# Patient Record
Sex: Female | Born: 1995 | Race: Black or African American | Hispanic: No | Marital: Single | Smoking: Never smoker
Health system: Southern US, Community
[De-identification: ages and names within clinical notes are randomized; demographics above are authoritative.]

## PROBLEM LIST (undated history)

## (undated) DIAGNOSIS — D649 Anemia, unspecified: Secondary | ICD-10-CM

## (undated) DIAGNOSIS — J45990 Exercise induced bronchospasm: Secondary | ICD-10-CM

## (undated) DIAGNOSIS — Z8619 Personal history of other infectious and parasitic diseases: Secondary | ICD-10-CM

## (undated) HISTORY — DX: Personal history of other infectious and parasitic diseases: Z86.19

## (undated) HISTORY — DX: Exercise induced bronchospasm: J45.990

---

## 2015-01-18 LAB — OB RESULTS CONSOLE ABO/RH: RH TYPE: POSITIVE

## 2015-01-18 LAB — OB RESULTS CONSOLE HIV ANTIBODY (ROUTINE TESTING): HIV: NONREACTIVE

## 2015-01-18 LAB — OB RESULTS CONSOLE RUBELLA ANTIBODY, IGM: RUBELLA: IMMUNE

## 2015-01-18 LAB — OB RESULTS CONSOLE GC/CHLAMYDIA
Chlamydia: NEGATIVE
Gonorrhea: NEGATIVE

## 2015-01-18 LAB — OB RESULTS CONSOLE RPR: RPR: NONREACTIVE

## 2015-01-18 LAB — OB RESULTS CONSOLE GBS: GBS: NEGATIVE

## 2015-01-18 LAB — OB RESULTS CONSOLE HEPATITIS B SURFACE ANTIGEN: Hepatitis B Surface Ag: NEGATIVE

## 2015-01-18 LAB — OB RESULTS CONSOLE ANTIBODY SCREEN: Antibody Screen: NEGATIVE

## 2015-02-07 ENCOUNTER — Telehealth (HOSPITAL_COMMUNITY): Payer: Self-pay | Admitting: *Deleted

## 2015-02-07 ENCOUNTER — Encounter (HOSPITAL_COMMUNITY): Payer: Self-pay | Admitting: *Deleted

## 2015-02-07 NOTE — Telephone Encounter (Signed)
Preadmission screen  

## 2015-02-12 ENCOUNTER — Other Ambulatory Visit: Payer: Self-pay | Admitting: Obstetrics and Gynecology

## 2015-02-12 ENCOUNTER — Inpatient Hospital Stay (HOSPITAL_COMMUNITY): Payer: Medicaid Other | Admitting: Anesthesiology

## 2015-02-12 ENCOUNTER — Encounter (HOSPITAL_COMMUNITY): Payer: Self-pay

## 2015-02-12 ENCOUNTER — Inpatient Hospital Stay (HOSPITAL_COMMUNITY)
Admission: RE | Admit: 2015-02-12 | Discharge: 2015-02-14 | DRG: 775 | Disposition: A | Discharge status: Home / Self Care | Payer: Medicaid Other | Source: Ambulatory Visit | Attending: Obstetrics and Gynecology | Admitting: Obstetrics and Gynecology

## 2015-02-12 VITALS — BP 109/63 | HR 66 | Temp 98.7°F | Resp 18 | Ht 67.0 in | Wt 170.0 lb

## 2015-02-12 DIAGNOSIS — D649 Anemia, unspecified: Secondary | ICD-10-CM | POA: Diagnosis present

## 2015-02-12 DIAGNOSIS — O9902 Anemia complicating childbirth: Secondary | ICD-10-CM | POA: Diagnosis present

## 2015-02-12 DIAGNOSIS — Z3A39 39 weeks gestation of pregnancy: Secondary | ICD-10-CM | POA: Diagnosis not present

## 2015-02-12 DIAGNOSIS — Z3490 Encounter for supervision of normal pregnancy, unspecified, unspecified trimester: Secondary | ICD-10-CM

## 2015-02-12 HISTORY — DX: Anemia, unspecified: D64.9

## 2015-02-12 LAB — CBC
HCT: 27.9 % — ABNORMAL LOW (ref 36.0–46.0)
Hemoglobin: 8.6 g/dL — ABNORMAL LOW (ref 12.0–15.0)
MCH: 24 pg — ABNORMAL LOW (ref 26.0–34.0)
MCHC: 30.8 g/dL (ref 30.0–36.0)
MCV: 77.9 fL — ABNORMAL LOW (ref 78.0–100.0)
Platelets: 245 10*3/uL (ref 150–400)
RBC: 3.58 MIL/uL — ABNORMAL LOW (ref 3.87–5.11)
RDW: 14.9 % (ref 11.5–15.5)
WBC: 6.7 10*3/uL (ref 4.0–10.5)

## 2015-02-12 MED ORDER — OXYCODONE-ACETAMINOPHEN 5-325 MG PO TABS: 1.0000 | ORAL_TABLET | ORAL | Status: DC | PRN

## 2015-02-12 MED ORDER — ONDANSETRON HCL 4 MG/2ML IJ SOLN
4.0000 mg | INTRAMUSCULAR | Status: DC | PRN
Start: 2015-02-12 — End: 2015-02-14

## 2015-02-12 MED ORDER — LACTATED RINGERS IV SOLN: 500.0000 mL | INTRAVENOUS | Status: DC | PRN

## 2015-02-12 MED ORDER — LIDOCAINE HCL (PF) 1 % IJ SOLN
INTRAMUSCULAR | Status: DC | PRN
  Administered 2015-02-12 (×2): 4 mL via EPIDURAL

## 2015-02-12 MED ORDER — DIPHENHYDRAMINE HCL 50 MG/ML IJ SOLN: 12.5000 mg | INTRAMUSCULAR | Status: DC | PRN

## 2015-02-12 MED ORDER — ACETAMINOPHEN 325 MG PO TABS: 650.0000 mg | ORAL_TABLET | ORAL | Status: DC | PRN

## 2015-02-12 MED ORDER — LACTATED RINGERS IV SOLN: INTRAVENOUS | Status: AC

## 2015-02-12 MED ORDER — SODIUM BICARBONATE 8.4 % IV SOLN
INTRAVENOUS | Status: DC | PRN
  Administered 2015-02-12: 4 mL via EPIDURAL
  Administered 2015-02-12: 5 mL via EPIDURAL

## 2015-02-12 MED ORDER — TETANUS-DIPHTH-ACELL PERTUSSIS 5-2.5-18.5 LF-MCG/0.5 IM SUSP: 0.5000 mL | Freq: Once | INTRAMUSCULAR | Status: DC

## 2015-02-12 MED ORDER — OXYTOCIN 40 UNITS IN LACTATED RINGERS INFUSION - SIMPLE MED
1.0000 m[IU]/min | INTRAVENOUS | Status: DC
  Administered 2015-02-12: 1 m[IU]/min via INTRAVENOUS
  Filled 2015-02-12: qty 1000

## 2015-02-12 MED ORDER — EPHEDRINE 5 MG/ML INJ
10.0000 mg | INTRAVENOUS | Status: DC | PRN
  Filled 2015-02-12: qty 2

## 2015-02-12 MED ORDER — WITCH HAZEL-GLYCERIN EX PADS: 1.0000 "application " | MEDICATED_PAD | CUTANEOUS | Status: DC | PRN

## 2015-02-12 MED ORDER — LACTATED RINGERS IV SOLN
INTRAVENOUS | Status: DC
  Administered 2015-02-12: 09:00:00 via INTRAVENOUS

## 2015-02-12 MED ORDER — ZOLPIDEM TARTRATE 5 MG PO TABS: 5.0000 mg | ORAL_TABLET | Freq: Every evening | ORAL | Status: DC | PRN

## 2015-02-12 MED ORDER — LANOLIN HYDROUS EX OINT: TOPICAL_OINTMENT | CUTANEOUS | Status: DC | PRN

## 2015-02-12 MED ORDER — PRENATAL MULTIVITAMIN CH
1.0000 | ORAL_TABLET | Freq: Every day | ORAL | Status: DC
  Administered 2015-02-13: 1 via ORAL
  Filled 2015-02-12: qty 1

## 2015-02-12 MED ORDER — ONDANSETRON HCL 4 MG PO TABS: 4.0000 mg | ORAL_TABLET | ORAL | Status: DC | PRN

## 2015-02-12 MED ORDER — OXYCODONE-ACETAMINOPHEN 5-325 MG PO TABS: 2.0000 | ORAL_TABLET | ORAL | Status: DC | PRN

## 2015-02-12 MED ORDER — DIBUCAINE 1 % RE OINT: 1.0000 "application " | TOPICAL_OINTMENT | RECTAL | Status: DC | PRN

## 2015-02-12 MED ORDER — SIMETHICONE 80 MG PO CHEW: 80.0000 mg | CHEWABLE_TABLET | ORAL | Status: DC | PRN

## 2015-02-12 MED ORDER — LIDOCAINE HCL (PF) 1 % IJ SOLN
30.0000 mL | INTRAMUSCULAR | Status: DC | PRN
  Filled 2015-02-12: qty 30

## 2015-02-12 MED ORDER — MEASLES, MUMPS & RUBELLA VAC ~~LOC~~ INJ: 0.5000 mL | INJECTION | Freq: Once | SUBCUTANEOUS | Status: DC

## 2015-02-12 MED ORDER — PRENATAL MULTIVITAMIN CH: 1.0000 | ORAL_TABLET | Freq: Every day | ORAL | Status: DC

## 2015-02-12 MED ORDER — FENTANYL 2.5 MCG/ML BUPIVACAINE 1/10 % EPIDURAL INFUSION (WH - ANES)
14.0000 mL/h | INTRAMUSCULAR | Status: DC | PRN
  Administered 2015-02-12 (×2): 14 mL/h via EPIDURAL
  Filled 2015-02-12: qty 125

## 2015-02-12 MED ORDER — OXYTOCIN BOLUS FROM INFUSION: 500.0000 mL | INTRAVENOUS | Status: DC

## 2015-02-12 MED ORDER — IBUPROFEN 600 MG PO TABS
600.0000 mg | ORAL_TABLET | Freq: Four times a day (QID) | ORAL | Status: DC
  Administered 2015-02-12 – 2015-02-13 (×6): 600 mg via ORAL
  Filled 2015-02-12 (×8): qty 1

## 2015-02-12 MED ORDER — PHENYLEPHRINE 40 MCG/ML (10ML) SYRINGE FOR IV PUSH (FOR BLOOD PRESSURE SUPPORT)
80.0000 ug | PREFILLED_SYRINGE | INTRAVENOUS | Status: DC | PRN
  Filled 2015-02-12: qty 2
  Filled 2015-02-12: qty 20

## 2015-02-12 MED ORDER — BUTORPHANOL TARTRATE 1 MG/ML IJ SOLN
1.0000 mg | Freq: Once | INTRAMUSCULAR | Status: AC
  Administered 2015-02-12: 1 mg via INTRAVENOUS
  Filled 2015-02-12: qty 1

## 2015-02-12 MED ORDER — BENZOCAINE-MENTHOL 20-0.5 % EX AERO
1.0000 "application " | INHALATION_SPRAY | CUTANEOUS | Status: DC | PRN
  Administered 2015-02-12: 1 via TOPICAL
  Filled 2015-02-12: qty 56

## 2015-02-12 MED ORDER — DIPHENHYDRAMINE HCL 25 MG PO CAPS
25.0000 mg | ORAL_CAPSULE | Freq: Four times a day (QID) | ORAL | Status: DC | PRN
Start: 2015-02-12 — End: 2015-02-14

## 2015-02-12 MED ORDER — SENNOSIDES-DOCUSATE SODIUM 8.6-50 MG PO TABS
2.0000 | ORAL_TABLET | ORAL | Status: DC
  Administered 2015-02-13: 2 via ORAL
  Filled 2015-02-12 (×2): qty 2

## 2015-02-12 MED ORDER — OXYTOCIN 40 UNITS IN LACTATED RINGERS INFUSION - SIMPLE MED: 62.5000 mL/h | INTRAVENOUS | Status: AC

## 2015-02-12 MED ORDER — OXYTOCIN 40 UNITS IN LACTATED RINGERS INFUSION - SIMPLE MED
62.5000 mL/h | INTRAVENOUS | Status: DC
  Administered 2015-02-12: 62.5 mL/h via INTRAVENOUS

## 2015-02-12 NOTE — Anesthesia Procedure Notes (Signed)
Epidural Patient location during procedure: OB Start time: 02/12/2015 11:14 AM  Staffing Anesthesiologist: Mal AmabileFOSTER, Rotha Cassels Performed by: anesthesiologist   Preanesthetic Checklist Completed: patient identified, site marked, surgical consent, pre-op evaluation, timeout performed, IV checked, risks and benefits discussed and monitors and equipment checked  Epidural Patient position: sitting Prep: site prepped and draped and DuraPrep Patient monitoring: continuous pulse ox and blood pressure Approach: midline Location: L3-L4 Injection technique: LOR air  Needle:  Needle type: Tuohy  Needle gauge: 17 G Needle length: 9 cm and 9 Needle insertion depth: 5 cm cm Catheter type: closed end flexible Catheter size: 19 Gauge Catheter at skin depth: 10 cm Test dose: negative and Other  Assessment Events: blood not aspirated, injection not painful, no injection resistance, negative IV test and no paresthesia  Additional Notes Patient identified. Risks and benefits discussed including failed block, incomplete  Pain control, post dural puncture headache, nerve damage, paralysis, blood pressure Changes, nausea, vomiting, reactions to medications-both toxic and allergic and post Partum back pain. All questions were answered. Patient expressed understanding and wished to proceed. Sterile technique was used throughout procedure. Epidural site was Dressed with sterile barrier dressing. No paresthesias, signs of intravascular injection Or signs of intrathecal spread were encountered.  Patient was more comfortable after the epidural was dosed. Please see RN's note for documentation of vital signs and FHR which are stable.

## 2015-02-12 NOTE — Progress Notes (Signed)
Patient ID: Daryel GeraldCynthia P Arocho, female   DOB: 09/18/1995, 19 y.o.   MRN: [id number] Pt admitted for induction. Cervix is 4 cm 70 % effaced and the vertex is at - 2 station. Arom produced slightly blood tinged fluid. Will begin pitocin. Last CT was + 01-18-15

## 2015-02-12 NOTE — Progress Notes (Signed)
Patient ID: Katrina GeraldCynthia P Rajagopalan, female   DOB: 09/11/1995, 19 y.o.   MRN: [id number] I was called at 1:04 PM and adviswed the pt was fully dilated with the vertex at +1 station. I came immediately and we instructed the pt to push.She has begun her pushing efforts. A dime's worth of caput is showing with pushing.

## 2015-02-12 NOTE — Progress Notes (Signed)
Patient ID: Katrina GeraldCynthia P Meares, female   DOB: 03/25/1995, 19 y.o.   MRN: [id number] Delivery note:   Pt pushed well and after proper positioning of her legs, she made good progress and delivered a living female infant spontaneously ROA over a second degree ML laceration. Apgars were 9 and 9 at 1 and 5 minutes. The placenta was intact and the uterus was normal. Rectal was negative. The laceration was repaired with 3-0 vicryl. EBL 200 cc's.

## 2015-02-12 NOTE — Anesthesia Preprocedure Evaluation (Signed)
Anesthesia Evaluation  Patient identified by MRN, date of birth, ID band Patient awake    Reviewed: Allergy & Precautions, H&P , Patient's Chart, lab work & pertinent test results  Airway Mallampati: II  TM Distance: >3 FB Neck ROM: full    Dental no notable dental hx. (+) Teeth Intact   Pulmonary neg pulmonary ROS,    Pulmonary exam normal breath sounds clear to auscultation       Cardiovascular negative cardio ROS Normal cardiovascular exam Rhythm:regular Rate:Normal     Neuro/Psych negative neurological ROS  negative psych ROS   GI/Hepatic negative GI ROS, Neg liver ROS,   Endo/Other  negative endocrine ROS  Renal/GU negative Renal ROS  negative genitourinary   Musculoskeletal   Abdominal   Peds  Hematology  (+) anemia ,   Anesthesia Other Findings   Reproductive/Obstetrics (+) Pregnancy                             Anesthesia Physical Anesthesia Plan  ASA: II  Anesthesia Plan: Epidural   Post-op Pain Management:    Induction:   Airway Management Planned:   Additional Equipment:   Intra-op Plan:   Post-operative Plan:   Informed Consent: I have reviewed the patients History and Physical, chart, labs and discussed the procedure including the risks, benefits and alternatives for the proposed anesthesia with the patient or authorized representative who has indicated his/her understanding and acceptance.     Plan Discussed with: Anesthesiologist  Anesthesia Plan Comments:         Anesthesia Quick Evaluation  

## 2015-02-12 NOTE — Lactation Note (Signed)
This note was copied from the chart of Katrina Sherisse Debroux. Lactation Consultation Note  Kirt BoysMartha RN helping mother breastfeed upon entering on R side. Mother needs some help compressing breast to get a deep latch. R nipple had piercing that started to close years ago.  L nipple has a current pierced bar. Helped mother latch on both breasts, some sucks and swallows observed. Mother needed help getting baby back on intermittently. Provided education to young parents regarding supply and demand, cluster feeding, size of stomach. Mother wants to breast and formula feed. Discussed the importance of establishing her milk supply.  Breastfeed first and then if desired offer formula after bf. Mom encouraged to feed baby 8-12 times/24 hours and with feeding cues.  Mom made aware of O/P services, breastfeeding support groups, community resources, and our phone # for post-discharge questions.    Patient Name: Katrina Aurther LoftCynIthia Cendejas Today's Date: 02/12/2015 Reason for consult: Initial assessment   Maternal Data Has patient been taught Hand Expression?: Yes Does the patient have breastfeeding experience prior to this delivery?: No  Feeding Feeding Type: Breast Fed Length of feed: 0 min  LATCH Score/Interventions Latch: Repeated attempts needed to sustain latch, nipple held in mouth throughout feeding, stimulation needed to elicit sucking reflex. Intervention(s): Skin to skin;Teach feeding cues;Waking techniques Intervention(s): Adjust position;Assist with latch;Breast massage;Breast compression  Audible Swallowing: None Intervention(s): Skin to skin;Hand expression  Type of Nipple: Everted at rest and after stimulation  Comfort (Breast/Nipple): Soft / non-tender     Hold (Positioning): Assistance needed to correctly position infant at breast and maintain latch. Intervention(s): Breastfeeding basics reviewed;Support Pillows;Position options;Skin to skin  LATCH Score: 6  Lactation Tools  Discussed/Used     Consult Status Consult Status: Follow-up Date: 02/13/15 Follow-up type: In-patient    Dahlia ByesBerkelhammer, [facility] 02/12/2015, 9:17 PM

## 2015-02-13 LAB — CBC
HEMATOCRIT: 26.7 % — AB (ref 36.0–46.0)
Hemoglobin: 8.3 g/dL — ABNORMAL LOW (ref 12.0–15.0)
MCH: 24.4 pg — ABNORMAL LOW (ref 26.0–34.0)
MCHC: 31.1 g/dL (ref 30.0–36.0)
MCV: 78.5 fL (ref 78.0–100.0)
Platelets: 216 10*3/uL (ref 150–400)
RBC: 3.4 MIL/uL — ABNORMAL LOW (ref 3.87–5.11)
RDW: 15 % (ref 11.5–15.5)
WBC: 10.8 10*3/uL — ABNORMAL HIGH (ref 4.0–10.5)

## 2015-02-13 LAB — RPR: RPR: NONREACTIVE

## 2015-02-13 NOTE — Progress Notes (Signed)
Patient ID: Katrina AlleyYNITHIA P Cannon, female   DOB: 06/20/1995, 19 y.o.   MRN: [id number] #1 afebrile No complaints HGB stable

## 2015-02-13 NOTE — Anesthesia Postprocedure Evaluation (Signed)
Anesthesia Post Note  Patient: Katrina Cannon  Procedure(s) Performed: * No procedures listed *  Patient location during evaluation: Mother Baby Anesthesia Type: Epidural Level of consciousness: awake and alert, oriented and patient cooperative Pain management: pain level controlled Vital Signs Assessment: post-procedure vital signs reviewed and stable Respiratory status: spontaneous breathing Cardiovascular status: stable Postop Assessment: no headache, epidural receding, patient able to bend at knees and no signs of nausea or vomiting Anesthetic complications: no    Last Vitals:  Filed Vitals:   02/12/15 2120 02/13/15 0520  BP: 128/59 108/58  Pulse: 81 62  Temp: 36.9 C 36.9 C  Resp: 18 16    Last Pain:  Filed Vitals:   02/13/15 0527  PainSc: Asleep                 Merrilyn PumaWRINKLE,Lyman Balingit

## 2015-02-14 MED ORDER — FERROUS SULFATE 325 (65 FE) MG PO TABS: 325.0000 mg | ORAL_TABLET | Freq: Two times a day (BID) | ORAL | Status: DC

## 2015-02-14 MED ORDER — IBUPROFEN 600 MG PO TABS: 600.0000 mg | ORAL_TABLET | Freq: Four times a day (QID) | ORAL | Status: DC | PRN

## 2015-02-14 NOTE — Discharge Summary (Signed)
Katrina Cannon:  Colglazier, Shandi             ACCOUNT NO.:  [id number]  MEDICAL RECORD NO.:  [id number]  LOCATION:  9135                          FACILITY:  [facility]  PHYSICIAN:  Malachi Prohomas F. Ambrose MantleHenley, M.D. DATE OF BIRTH:  01/24/1996  DATE OF ADMISSION:  02/12/2015 DATE OF DISCHARGE:  02/14/2015                              DISCHARGE SUMMARY   This is a 19 year old black female, who was admitted for induction of labor at her request.  On admission, she was 4 cm, she reached full dilatation, and pushed well, and after proper positioning of her legs, she made good progress and delivered a living female infant spontaneously ROA over a second-degree midline laceration by Dr. Ambrose MantleHenley. Apgars were 9 and 9 at one and five minutes.  The placenta was intact. Uterus was normal.  Rectal was negative.  Laceration was repaired with 3- 0 Vicryl.  Blood loss 200 mL.  Postpartum, the patient did well and was discharged on the second postpartum day.  Initial hemoglobin 8.6, hematocrit 27.9, white count 6700, platelet count 245,000, and followup hemoglobin 8.3.  RPR nonreactive.  FINAL DIAGNOSES:  Intrauterine pregnancy at 39 weeks, delivered vertex.  OPERATION:  Spontaneous delivery vertex, second-degree midline laceration and repair.  FINAL CONDITION:  Improved.  INSTRUCTIONS:  Include our regular discharge instruction booklet as well as the after visit summary.  Prescription for Motrin 600 mg 30 tablets, 1 every 6 hours as needed for pain and ferrous sulfate 325 mg 60 tablets 1 twice a day with 3 refills.  The patient is advised to return to the office in 6 weeks for followup examination.     Malachi Prohomas F. Ambrose MantleHenley, M.D.     TFH/MEDQ  D:  02/14/2015  T:  02/14/2015  Job:  [id number]

## 2015-02-14 NOTE — H&P (Signed)
Katrina Cannon, Katrina Cannon             ACCOUNT NO.:  [id number]  MEDICAL RECORD NO.:  [id number]  LOCATION:  9135                          FACILITY:  [facility]  PHYSICIAN:  Malachi Pro. Ambrose Mantle, M.D. DATE OF BIRTH:  [birth year]  DATE OF ADMISSION:  02/12/2015 DATE OF DISCHARGE:                             HISTORY & PHYSICAL   HISTORY OF PRESENT ILLNESS:  This is a 19 year old black female, para 0- 0-1-0, gravida 2, [facility] February 19, 2015, based on an ultrasound on Jul 23, 2014, 9 weeks and 6 days, who is admitted for induction of labor at her request.  The patient began her prenatal course in [place], [place], had 1 visit and an ultrasound that placed her due date at February 19, 2015, done at 9 weeks and 6 days.  She came to our office at 26 weeks and 6 days for her first visit.  On her ultrasound here, the cervix was shortened and she was begun on vaginal progesterone.  She was noted to have a globe in her left axilla at 29 weeks that was considered axillary breast tissue.  She was offered imaging, she did not take the imaging.  At 33 weeks, she complained of decreased fetal movement. Nonstress test was done, size seemed to be less than dates, so ultrasound was suggested.  At that ultrasound at 35 weeks and 3 days, the fetus was thought to be 34 weeks and 0 days, 21.9% estimated, weight was 5 pounds 2 ounces at that time.  AFI was 13.97.  She was noted to be 2 cm, 50%, at 35 weeks and 3 days, and on January 26, 2015, she called and asked for induction on December 17th.  I was on-call that day, Dr. Jackelyn Knife asked her to ask me and I agreed to induce her labor.  Blood group and type is O positive with a negative antibody, RPR negative, hepatitis B surface antigen negative, HIV negative, GC and Chlamydia negative on January 18, 2015.  She did have a history of positive chlamydia early in pregnancy.  One-hour Glucola was 68.  On January 18, 2015, she did have another positive chlamydia and this  was treated with Zithromax 1 g.  Group B strep was negative.  PAST MEDICAL HISTORY:  Reveals no significant illnesses, although she has had positive chlamydia.  SURGICAL HISTORY:  None.  ALLERGIES:  No known drug allergies.  No latex or food allergies.  The patient was treated for short cervical length with vaginal progesterone.  SOCIAL HISTORY:  The patient smoked prior to pregnancy.  Does not drink or use illicit drugs.  She is single.  FAMILY HISTORY:  Mother with anemia.  Maternal grandmother with high blood pressure.  PHYSICAL EXAMINATION:  VITAL SIGNS:  At her last prenatal visit on February 08, 2015, the blood pressure was 150/78, and 124/78, her weight was 171 pounds. HEART:  Normal size and sounds. LUNGS:  Clear to auscultation. GU:  Fundal height was 37 cm.  The cervix was 3 cm, 70%, vertex at a -2 by Dr. Jackelyn Knife.  ADMITTING IMPRESSION:  Intrauterine pregnancy at 39 weeks.  The patient requested induction of labor.  She is admitted for induction of labor.  Malachi Prohomas F. Ambrose MantleHenley, M.D.     TFH/MEDQ  D:  02/12/2015  T:  02/12/2015  Job:  [id number]

## 2015-02-14 NOTE — Progress Notes (Signed)
Patient ID: Katrina AlleyYNITHIA P Cannon, female   DOB: 10/25/1995, 19 y.o.   MRN: [id number] #2 afebrile BP normal Will d/c on ferrous sulfate.

## 2015-02-14 NOTE — Discharge Instructions (Signed)
booklet °

## 2015-02-14 NOTE — Lactation Note (Addendum)
This note was copied from the chart of Katrina Cannon. Lactation Consultation Note  Patient Name: Katrina Cannon Today's Date: 02/14/2015 Reason for consult: Follow-up assessment   Follow up with mom prior to D/C. Mom says she plans to breast and bottle feed. Infant with 8 BF for 15-45 minutes, 2 bottle feeds of 20 cc Alimentum, 4 voids and 3 stools since birth. Infant 6 lb 5.6 oz with 4% weight loss since birth. Infant cueing to feed, mom opening a bottle. Mom reports nipple tenderness with latch that improves with feeding. Offered to assist putting infant to breast, mom declined assistance. Reviewed infant stomach size, NB Nutritional Needs, NB feeding behavior, Engorgement protocol, Supply and Demand, I/O, BF Positions. Enc mom to feed infant 8-12 x in 24 hours at first feeding cues at the breast prior to formula feeding to establish a good milk supply. MOm voiced understanding to all teaching. Reviewed all BF information in Taking Care of Baby and Me Booklet. [facility] and will call and make WIC appt. She currently does not have a Ped. Advised her that BF babies usually seen for f/u within 1-2 days of d/c. Reviewed LC Brochure, mom aware of OP LC Services, Support Groups and phone #. Advised mom to call with questions/concerns.    Maternal Data Formula Feeding for Exclusion: Yes Reason for exclusion: Mother's choice to formula and breast feed on admission Has patient been taught Hand Expression?: Yes Does the patient have breastfeeding experience prior to this delivery?: No  Feeding    LATCH Score/Interventions                      Lactation Tools Discussed/Used WIC Program: Yes Pump Review: Milk Storage   Consult Status Consult Status: Complete Follow-up type: Call as needed    Ed BlalockSharon S Arali Somera 02/14/2015, 8:38 AM

## 2015-02-19 ENCOUNTER — Inpatient Hospital Stay (HOSPITAL_COMMUNITY): Admission: AD | Admit: 2015-02-19 | Payer: Self-pay | Source: Ambulatory Visit | Admitting: Obstetrics and Gynecology

## 2016-02-07 ENCOUNTER — Ambulatory Visit: Payer: Medicaid Other | Admitting: Family

## 2016-02-14 ENCOUNTER — Ambulatory Visit (INDEPENDENT_AMBULATORY_CARE_PROVIDER_SITE_OTHER): Payer: PRIVATE HEALTH INSURANCE | Admitting: Family

## 2016-02-14 ENCOUNTER — Other Ambulatory Visit (INDEPENDENT_AMBULATORY_CARE_PROVIDER_SITE_OTHER): Payer: PRIVATE HEALTH INSURANCE

## 2016-02-14 ENCOUNTER — Encounter: Payer: Self-pay | Admitting: Family

## 2016-02-14 VITALS — BP 122/70 | HR 62 | Temp 98.2°F | Wt 145.0 lb

## 2016-02-14 DIAGNOSIS — M79674 Pain in right toe(s): Secondary | ICD-10-CM | POA: Diagnosis not present

## 2016-02-14 DIAGNOSIS — M79675 Pain in left toe(s): Secondary | ICD-10-CM

## 2016-02-14 DIAGNOSIS — Z23 Encounter for immunization: Secondary | ICD-10-CM

## 2016-02-14 DIAGNOSIS — D509 Iron deficiency anemia, unspecified: Secondary | ICD-10-CM | POA: Diagnosis not present

## 2016-02-14 LAB — IBC PANEL
IRON: 145 ug/dL (ref 42–145)
Saturation Ratios: 37.7 % (ref 20.0–50.0)
TRANSFERRIN: 275 mg/dL (ref 212.0–360.0)

## 2016-02-14 LAB — CBC
HEMATOCRIT: 39.5 % (ref 36.0–46.0)
Hemoglobin: 13.1 g/dL (ref 12.0–15.0)
MCHC: 33.1 g/dL (ref 30.0–36.0)
MCV: 87.6 fl (ref 78.0–100.0)
Platelets: 305 10*3/uL (ref 150.0–400.0)
RBC: 4.51 Mil/uL (ref 3.87–5.11)
RDW: 14.7 % (ref 11.5–15.5)
WBC: 6.8 10*3/uL (ref 4.0–10.5)

## 2016-02-14 NOTE — Progress Notes (Signed)
Subjective:    Patient ID: Katrina Cannon, female    DOB: 09/03/1995, 20 y.o.   MRN: [id number]  Chief Complaint  Patient presents with  . Establish Care    iron deficiency, toe pain    HPI:  Katrina Cannon is a 20 y.o. female who  has a past medical history of Anemia; Exercise-induced asthma; and chlamydia infection. and presents today for an office visit to establish care.  1.) Anemia - Previously diagnosed with anemia. Currently maintained on ferrous sulfate that she reports taking as prescribed and denies adverse side effects. Does have occasional shortness of breath with activities. Denies bleeding or heavy menstrual cycles. Has significant family history for anemia.   2.) Great toe sprains - This is a new problem. Associated symptom of bilateral great toe pain has been going on since she was in high school about 3 years ago that generally waxes and wanes. Describes that she used to run track on a regular basis and does not remember a specific injury to either great toe, although remembers having pain and having the athletic trainer tape her toes on occasion which seemed to help. Not currently having pain but notes it to be around the MCP joint. Modifying factors include Aleve and ibuprofen as needed as well as ice when painful. Severity ranges from mild to severe at times. No numbness or tingling. Pain is described as occasionally sharp when it occurs.    No Known Allergies    Outpatient Medications Prior to Visit  Medication Sig Dispense Refill  . ferrous sulfate 325 (65 FE) MG tablet Take 1 tablet (325 mg total) by mouth 2 (two) times daily with a meal. 60 tablet 3  . ibuprofen (ADVIL,MOTRIN) 600 MG tablet Take 1 tablet (600 mg total) by mouth every 6 (six) hours as needed. 30 tablet 0  . Prenatal Vit-Fe Fumarate-FA (PRENATAL MULTIVITAMIN) TABS tablet Take 1 tablet by mouth daily at 12 noon.     No facility-administered medications prior to visit.      Past Medical  History:  Diagnosis Date  . Anemia   . Exercise-induced asthma   . Hx of chlamydia infection       History reviewed. No pertinent surgical history.    Family History  Problem Relation Age of Onset  . Healthy Mother   . Healthy Father   . Hypertension Maternal Grandmother   . Hypertension Maternal Grandfather       Social History   Social History  . Marital status: Single    Spouse name: N/A  . Number of children: 1  . Years of education: 8315   Occupational History  . Sales Associate    Social History Main Topics  . Smoking status: Never Smoker  . Smokeless tobacco: Never Used  . Alcohol use Yes     Comment: occasionally  . Drug use: No  . Sexual activity: Not on file   Other Topics Concern  . Not on file   Social History Narrative  . No narrative on file      Review of Systems  Constitutional: Negative for chills and fever.  Respiratory: Positive for shortness of breath (Occasional).   Cardiovascular: Negative for chest pain, palpitations and leg swelling.  Musculoskeletal:       Positive for bilateral toe pain  Neurological: Negative for weakness and numbness.       Objective:    BP 122/70   Pulse 62   Temp 98.2 F (36.8 C)  Wt 145 lb (65.8 kg)   SpO2 99%   BMI 22.71 kg/m  Nursing note and vital signs reviewed.  Physical Exam  Constitutional: She is oriented to person, place, and time. She appears well-developed and well-nourished. No distress.  Cardiovascular: Normal rate, regular rhythm, normal heart sounds and intact distal pulses.   Pulmonary/Chest: Effort normal and breath sounds normal.  Musculoskeletal:  Bilateral great toes - no obvious deformity, discoloration, or edema. No palpable tenderness, crepitus, or deformity. Range of motion is equal bilaterally with no discomfort Neer and ranges of motion. Capillary refill is intact and appropriate. Metatarsal glide is negative.  Neurological: She is alert and oriented to person,  place, and time.  Skin: Skin is warm and dry. There is pallor.  Psychiatric: She has a normal mood and affect. Her behavior is normal. Judgment and thought content normal.        Assessment & Plan:   Problem List Items Addressed This Visit      Other   Iron deficiency anemia - Primary    Previously diagnosed with iron deficiency anemia maintained on ferrous sulfate without complication or side effects. There is mild pallor noted on exam today. Obtain CBC and IBC panel. Continue current dosage of thirst flu pending blood work results.      Relevant Orders   CBC (Completed)   IBC panel (Completed)   Toe pain, bilateral    Bilateral toe pain in the setting of previous 2 injuries secondary to running track with no significant findings or pain today. Recommend conservative treatment with ice, over-the-counter medications as needed for symptom relief and good supportive shoes. No treatment necessary at this time. Follow-up if symptoms worsen or return.       Other Visit Diagnoses    Need for prophylactic vaccination with combined diphtheria-tetanus-pertussis (DTP) vaccine       Relevant Orders   Tdap vaccine greater than or equal to 7yo IM (Completed)       I have discontinued [name]'s prenatal multivitamin. I am also having her maintain her ibuprofen and ferrous sulfate.   Follow-up: Return if symptoms worsen or fail to improve.  Jeanine Luzalone, Icis Budreau, FNP

## 2016-02-14 NOTE — Progress Notes (Signed)
Pre visit review using our clinic review tool, if applicable. No additional management support is needed unless otherwise documented below in the visit note. 

## 2016-02-14 NOTE — Assessment & Plan Note (Signed)
Bilateral toe pain in the setting of previous 2 injuries secondary to running track with no significant findings or pain today. Recommend conservative treatment with ice, over-the-counter medications as needed for symptom relief and good supportive shoes. No treatment necessary at this time. Follow-up if symptoms worsen or return.

## 2016-02-14 NOTE — Patient Instructions (Signed)
Thank you for choosing [facility].  SUMMARY AND INSTRUCTIONS:  Ice your toes as needed when inflamed. Recommend a good supportive shoe such as Allegra, Dansko, or New Balance >700.   Ibuprofen or aleve as needed.  Please schedule a time for your physical at your convenience.  Medication:  Please continue to take your ferrous sulfate.  Labs:  Please stop by the lab on the lower level of the building for your blood work. Your results will be released to MyChart (or called to you) after review, usually within 72 hours after test completion. If any changes need to be made, you will be notified at that same time.  1.) The lab is open from 7:30am to 5:30 pm Monday-Friday 2.) No appointment is necessary 3.) Fasting (if needed) is 6-8 hours after food and drink; black coffee and water are okay   Follow up:  If your symptoms worsen or fail to improve, please contact our office for further instruction, or in case of emergency go directly to the emergency room at the closest medical facility.    Iron Deficiency Anemia, Adult Iron deficiency anemia is a condition in which the concentration of red blood cells or hemoglobin in the blood is below normal because of too little iron. Hemoglobin is a substance in red blood cells that carries oxygen to the body's tissues. When the concentration of red blood cells or hemoglobin is too low, not enough oxygen reaches these tissues. Iron deficiency anemia is usually long-lasting (chronic) and it develops over time. It may or may not cause symptoms. It is a common type of anemia. What are the causes? This condition may be caused by:  Not enough iron in the diet.  Blood loss caused by bleeding in the intestine.  Blood loss from a gastrointestinal condition like Crohn disease.  Frequent blood draws, such as from blood donation.  Abnormal absorption in the gut.  Heavy menstrual periods in women.  Cancers of the gastrointestinal system, such  as colon cancer. What are the signs or symptoms? Symptoms of this condition may include:  Fatigue.  Headache.  Pale skin, lips, and nail beds.  Poor appetite.  Weakness.  Shortness of breath.  Dizziness.  Cold hands and feet.  Fast or irregular heartbeat.  Irritability. This is more common in severe anemia.  Rapid breathing. This is more common in severe anemia. Mild anemia may not cause any symptoms. How is this diagnosed? This condition is diagnosed based on:  Your medical history.  A physical exam.  Blood tests. You may have additional tests to find the underlying cause of your anemia, such as:  Testing for blood in the stool (fecal occult blood test).  A procedure to see inside your colon and rectum (colonoscopy).  A procedure to see inside your esophagus and stomach (endoscopy).  A test in which cells are removed from bone marrow (bone marrow aspiration) or fluid is removed from the bone marrow to be examined (biopsy). This is rarely needed. How is this treated? This condition is treated by correcting the cause of your iron deficiency. Treatment may involve:  Adding iron-rich foods to your diet.  Taking iron supplements. If you are pregnant or breastfeeding, you may need to take extra iron because your normal diet usually does not provide the amount of iron that you need.  Increasing vitamin C intake. Vitamin C helps your body absorb iron. Your health care provider may recommend that you take iron supplements along with a glass of orange  juice or a vitamin C supplement.  Medicines to make heavy menstrual flow lighter.  Surgery. You may need repeat blood tests to determine whether treatment is working. Depending on the underlying cause, the anemia should be corrected within 2 months of starting treatment. If the treatment does not seem to be working, you may need more testing. Follow these instructions at home: Medicines  Take over-the-counter and  prescription medicines only as told by your health care provider. This includes iron supplements and vitamins.  If you cannot tolerate taking iron supplements by mouth, talk with your health care provider about taking them through a vein (intravenously) or an injection into a muscle.  For the best iron absorption, you should take iron supplements when your stomach is empty. If you cannot tolerate them on an empty stomach, you may need to take them with food.  Do not drink milk or take antacids at the same time as your iron supplements. Milk and antacids may interfere with iron absorption.  Iron supplements can cause constipation. To prevent constipation, include fiber in your diet as told by your health care provider. A stool softener may also be recommended. Eating and drinking  Talk with your health care provider before changing your diet. He or she may recommend that you eat foods that contain a lot of iron, such as:  Liver.  Low-fat (lean) beef.  Breads and cereals that have iron added to them (are fortified).  Eggs.  Dried fruit.  Dark green, leafy vegetables.  To help your body use the iron from iron-rich foods, eat those foods at the same time as fresh fruits and vegetables that are high in vitamin C. Foods that are high in vitamin C include:  Oranges.  Peppers.  Tomatoes.  Mangoes.  Drinkenoughfluid to keep your urine clear or pale yellow. General instructions  Return to your normal activities as told by your health care provider. Ask your health care provider what activities are safe for you.  Practice good hygiene. Anemia can make you more prone to illness and infection.  Keep all follow-up visits as told by your health care provider. This is important. Contact a health care provider if:  You feel nauseous or you vomit.  You feel weak.  You have unexplained sweating.  You develop symptoms of constipation, such as:  Having fewer than three bowel  movements a week.  Straining to have a bowel movement.  Having stools that are hard, dry, or larger than normal.  Feeling full or bloated.  Pain in the lower abdomen.  Not feeling relief after having a bowel movement. Get help right away if:  You faint. If this happens, do not drive yourself to the hospital. Call your local emergency services (911 in the [place]  You have chest pain.  You have shortness of breath that:  Is severe.  Gets worse with physical activity.  You have a rapid heartbeat.  You become light-headed when getting up from a sitting or lying down position. This information is not intended to replace advice given to you by your health care provider. Make sure you discuss any questions you have with your health care provider. Document Released: 02/10/2000 Document Revised: 11/02/2015 Document Reviewed: 11/02/2015 Elsevier Interactive Patient Education  2017 [employer]

## 2016-02-14 NOTE — Assessment & Plan Note (Signed)
Previously diagnosed with iron deficiency anemia maintained on ferrous sulfate without complication or side effects. There is mild pallor noted on exam today. Obtain CBC and IBC panel. Continue current dosage of thirst flu pending blood work results.

## 2020-05-03 ENCOUNTER — Ambulatory Visit: Payer: Self-pay | Admitting: Psychology

## 2020-05-18 ENCOUNTER — Ambulatory Visit: Payer: Self-pay | Admitting: Psychology

## 2020-10-05 ENCOUNTER — Ambulatory Visit
Admission: EM | Admit: 2020-10-05 | Discharge: 2020-10-05 | Disposition: A | Discharge status: Home / Self Care | Payer: PRIVATE HEALTH INSURANCE | Attending: Urgent Care | Admitting: Urgent Care

## 2020-10-05 ENCOUNTER — Other Ambulatory Visit: Payer: Self-pay

## 2020-10-05 DIAGNOSIS — L0231 Cutaneous abscess of buttock: Secondary | ICD-10-CM

## 2020-10-05 MED ORDER — NAPROXEN 500 MG PO TABS: 500.0000 mg | ORAL_TABLET | Freq: Two times a day (BID) | ORAL | 0 refills | Status: DC

## 2020-10-05 MED ORDER — DOXYCYCLINE HYCLATE 100 MG PO CAPS: 100.0000 mg | ORAL_CAPSULE | Freq: Two times a day (BID) | ORAL | 0 refills | Status: DC

## 2020-10-05 NOTE — Discharge Instructions (Addendum)
Please change your dressing 3-5 times daily. Do not apply any ointments or creams. Each time you change your dressing, make sure that you are pressing on the wound to get pus to come out.  Try your best to have a family member help you clean gently around the perimeter of the wound with gentle soap and warm water. Pat your wound dry and let it air out if possible to make sure it is dry before reapplying another dressing.   

## 2020-10-05 NOTE — ED Provider Notes (Signed)
  [facility]   MRN: [id number] DOB: May 02, 1995  Subjective:   Katrina Cannon is a 25 y.o. female presenting for 5 day history of recurrent buttock pain, swelling.  Had a similar lesion about 3 months ago that required incision and drainage then as well.  Prior to that, she had never previously had difficulty with boils and infections.  No current facility-administered medications for this encounter. No current outpatient medications on file.   No Known Allergies  Past Medical History:  Diagnosis Date   Anemia    Exercise-induced asthma    Hx of chlamydia infection      History reviewed. No pertinent surgical history.  Family History  Problem Relation Age of Onset   Healthy Mother    Healthy Father    Hypertension Maternal Grandmother    Hypertension Maternal Grandfather     Social History   Tobacco Use   Smoking status: Never   Smokeless tobacco: Never  Substance Use Topics   Alcohol use: Yes    Comment: occasionally   Drug use: No    ROS   Objective:   Vitals: BP 126/75 (BP Location: Left Arm)   Pulse 91   Temp 98.4 F (36.9 C) (Oral)   Resp 18   SpO2 96%   Breastfeeding No   Physical Exam Constitutional:      General: She is not in acute distress.    Appearance: Normal appearance. She is well-developed. She is not ill-appearing.  HENT:     Head: Normocephalic and atraumatic.     Nose: Nose normal.     Mouth/Throat:     Mouth: Mucous membranes are moist.     Pharynx: Oropharynx is clear.  Eyes:     General: No scleral icterus.    Extraocular Movements: Extraocular movements intact.     Pupils: Pupils are equal, round, and reactive to light.  Cardiovascular:     Rate and Rhythm: Normal rate.  Pulmonary:     Effort: Pulmonary effort is normal.  Genitourinary:   Skin:    General: Skin is warm and dry.  Neurological:     General: No focal deficit present.     Mental Status: She is alert and oriented to person, place, and  time.  Psychiatric:        Mood and Affect: Mood normal.        Behavior: Behavior normal.    PROCEDURE NOTE: I&D of Abscess Verbal consent obtained. Local anesthesia with 3cc of 2% lidocaine with epinephrine. Site cleansed with Betadine. Incision of 1/2cm was made using an 11 blade, 5cc expressed consisting of a mixture of pus and serosanguinous fluid. Wound cavity was explored with curved hemostats and loculations loosened. Cleansed and dressed.   Assessment and Plan :   PDMP not reviewed this encounter.  1. Left buttock abscess     Successful I&D performed.  Wound care reviewed.  Start doxycycline for the abscess, naproxen for pain and inflammation.  Follow-up with PCP for recurrent abscesses, consider referral to dermatology.  Counseled patient on potential for adverse effects with medications prescribed/recommended today, ER and return-to-clinic precautions discussed, patient verbalized understanding.    Wallis Bamberg, [place] 10/05/20 8280

## 2020-10-05 NOTE — ED Triage Notes (Signed)
Pt c/o abscess to center of buttocks x4-5 days. Denies drainage. States this is the 3rd one is same spot in the past 3 months.

## 2020-11-18 ENCOUNTER — Emergency Department (HOSPITAL_COMMUNITY): Payer: No Typology Code available for payment source

## 2020-11-18 ENCOUNTER — Emergency Department (HOSPITAL_COMMUNITY)
Admission: EM | Admit: 2020-11-18 | Discharge: 2020-11-18 | Disposition: A | Discharge status: Home / Self Care | Payer: No Typology Code available for payment source | Attending: Emergency Medicine | Admitting: Emergency Medicine

## 2020-11-18 ENCOUNTER — Other Ambulatory Visit: Payer: Self-pay

## 2020-11-18 DIAGNOSIS — S199XXA Unspecified injury of neck, initial encounter: Secondary | ICD-10-CM | POA: Diagnosis present

## 2020-11-18 DIAGNOSIS — S161XXA Strain of muscle, fascia and tendon at neck level, initial encounter: Secondary | ICD-10-CM | POA: Insufficient documentation

## 2020-11-18 DIAGNOSIS — S80212A Abrasion, left knee, initial encounter: Secondary | ICD-10-CM | POA: Insufficient documentation

## 2020-11-18 DIAGNOSIS — Y92410 Unspecified street and highway as the place of occurrence of the external cause: Secondary | ICD-10-CM | POA: Insufficient documentation

## 2020-11-18 LAB — HCG, SERUM, QUALITATIVE: Preg, Serum: NEGATIVE

## 2020-11-18 MED ORDER — IBUPROFEN 600 MG PO TABS: 600.0000 mg | ORAL_TABLET | Freq: Four times a day (QID) | ORAL | 0 refills | Status: DC | PRN

## 2020-11-18 MED ORDER — ACETAMINOPHEN 325 MG PO TABS
650.0000 mg | ORAL_TABLET | ORAL | Status: DC | PRN
  Administered 2020-11-18: 650 mg via ORAL
  Filled 2020-11-18: qty 2

## 2020-11-18 MED ORDER — METHOCARBAMOL 500 MG PO TABS
500.0000 mg | ORAL_TABLET | ORAL | Status: DC | PRN
  Administered 2020-11-18: 500 mg via ORAL
  Filled 2020-11-18: qty 1

## 2020-11-18 MED ORDER — METHOCARBAMOL 500 MG PO TABS: 500.0000 mg | ORAL_TABLET | Freq: Three times a day (TID) | ORAL | 0 refills | Status: DC | PRN
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
## 2021-07-08 ENCOUNTER — Encounter (HOSPITAL_COMMUNITY): Payer: Self-pay | Admitting: Emergency Medicine

## 2021-07-08 ENCOUNTER — Ambulatory Visit (HOSPITAL_COMMUNITY)
Admission: EM | Admit: 2021-07-08 | Discharge: 2021-07-08 | Disposition: A | Discharge status: Home / Self Care | Payer: BC Managed Care – PPO | Attending: Emergency Medicine | Admitting: Emergency Medicine

## 2021-07-08 DIAGNOSIS — L0231 Cutaneous abscess of buttock: Secondary | ICD-10-CM | POA: Diagnosis not present

## 2021-07-08 DIAGNOSIS — N926 Irregular menstruation, unspecified: Secondary | ICD-10-CM | POA: Insufficient documentation

## 2021-07-08 LAB — TSH: TSH: 1.503 u[IU]/mL (ref 0.350–4.500)

## 2021-07-08 MED ORDER — DOXYCYCLINE HYCLATE 100 MG PO CAPS: 100.0000 mg | ORAL_CAPSULE | Freq: Two times a day (BID) | ORAL | 0 refills | Status: AC

## 2021-07-08 NOTE — Discharge Instructions (Signed)
Take doxycycline twice daily for 7 days ? ?Hold warm-hot compresses to affected area at least 4 times a day, this helps to facilitate draining, the more the better ? ?If your abscess has been reoccurring in the same location it most likely has a sac underneath the skin that is refilling, you will need surgical evaluation to have that removed ? ?If you have abscesses over multiple sites of your body you may follow-up with dermatology as well for evaluation for hidradenitis, information is listed below ? ?Please return for evaluation for increased swelling, increased tenderness or pain, non healing site, non draining site, you begin to have fever or chills  ? ?Your thyroid level is pending, you will be notified of any concerning values, make an appointment with your primary doctor for reevaluation of your symptoms, as there may be additional blood work that can be done ? ?

## 2021-07-08 NOTE — ED Triage Notes (Signed)
Pt reports an abscess on the lining of her buttocks area x 1 week.  States she believes she has HS but has not been dx. Tried warm compresses with no relief.  ?

## 2021-07-08 NOTE — ED Provider Notes (Signed)
?[facility] ? ? ? ?CSN: [id number] ?Arrival date & time: 07/08/21  1012 ? ? ?  ? ?History   ?Chief Complaint ?Chief Complaint  ?Patient presents with  ? Abscess  ? ? ?HPI ?Katrina Cannon is a 26 y.o. female.  ? ?Patient presents with abscess to the left buttocks for 1 week.  Endorses that abscess has been reoccurring in the same spot for 1 year.  Abscess has increased in size and become tender, making it painful to sit.  Denies fever, chills or drainage.   ? ?Patient requesting lab work for thyroid, concerns of irregularity due to irregular cycle for 1 year and intermittent constipation.  Denies heat or cold intolerance, weight loss or gain ? ?Past Medical History:  ?Diagnosis Date  ? Anemia   ? Exercise-induced asthma   ? Hx of chlamydia infection   ? ? ?Patient Active Problem List  ? Diagnosis Date Noted  ? Iron deficiency anemia 02/14/2016  ? Toe pain, bilateral 02/14/2016  ? Pregnancy 02/12/2015  ? SVD (spontaneous vaginal delivery) 02/12/2015  ? ? ?History reviewed. No pertinent surgical history. ? ?OB History   ? ? Gravida  ?1  ? Para  ?1  ? Term  ?1  ? Preterm  ?   ? AB  ?   ? Living  ?1  ?  ? ? SAB  ?   ? IAB  ?   ? Ectopic  ?   ? Multiple  ?0  ? Live Births  ?1  ?   ?  ?  ? ? ? ?Home Medications   ? ?Prior to Admission medications   ?Medication Sig Start Date End Date Taking? Authorizing Provider  ?doxycycline (VIBRAMYCIN) 100 MG capsule Take 1 capsule (100 mg total) by mouth 2 (two) times daily. 10/05/20   Wallis Bamberg, PA-C  ?ibuprofen (ADVIL) 600 MG tablet Take 1 tablet (600 mg total) by mouth every 6 (six) hours as needed for mild pain or headache. 11/18/20   Edwin Dada P, DO  ?methocarbamol (ROBAXIN) 500 MG tablet Take 1 tablet (500 mg total) by mouth every 8 (eight) hours as needed for muscle spasms. 11/18/20   Franne Forts, DO  ?naproxen (NAPROSYN) 500 MG tablet Take 1 tablet (500 mg total) by mouth 2 (two) times daily with a meal. 10/05/20   Wallis Bamberg, PA-C  ? ? ?Family History ?Family  History  ?Problem Relation Age of Onset  ? Healthy Mother   ? Healthy Father   ? Hypertension Maternal Grandmother   ? Hypertension Maternal Grandfather   ? ? ?Social History ?Social History  ? ?Tobacco Use  ? Smoking status: Never  ? Smokeless tobacco: Never  ?Substance Use Topics  ? Alcohol use: Yes  ?  Comment: occasionally  ? Drug use: No  ? ? ? ?Allergies   ?Patient has no known allergies. ? ? ?Review of Systems ?Review of Systems ?Defer to HPI  ? ?Physical Exam ?Triage Vital Signs ?ED Triage Vitals  ?Enc Vitals Group  ?   BP 07/08/21 1056 122/87  ?   Pulse Rate 07/08/21 1056 68  ?   Resp 07/08/21 1056 18  ?   Temp 07/08/21 1056 98.4 ?F (36.9 ?C)  ?   Temp Source 07/08/21 1056 Oral  ?   SpO2 07/08/21 1056 97 %  ?   Weight 07/08/21 1055 138 lb 0.1 oz (62.6 kg)  ?   Height 07/08/21 1055 5\' 6"  (1.676 m)  ?  Head Circumference --   ?   Peak Flow --   ?   Pain Score 07/08/21 1055 8  ?   Pain Loc --   ?   Pain Edu? --   ?   Excl. in GC? --   ? ?No data found. ? ?Updated Vital Signs ?BP 122/87 (BP Location: Right Arm)   Pulse 68   Temp 98.4 ?F (36.9 ?C) (Oral)   Resp 18   Ht 5\' 6"  (1.676 m)   Wt 138 lb 0.1 oz (62.6 kg)   SpO2 97%   BMI 22.28 kg/m?  ? ?Visual Acuity ?Right Eye Distance:   ?Left Eye Distance:   ?Bilateral Distance:   ? ?Right Eye Near:   ?Left Eye Near:    ?Bilateral Near:    ? ?Physical Exam ?Constitutional:   ?   Appearance: Normal appearance.  ?HENT:  ?   Head: Normocephalic.  ?   Mouth/Throat:  ?   Mouth: Mucous membranes are moist.  ?   Pharynx: Oropharynx is clear.  ?Eyes:  ?   Extraocular Movements: Extraocular movements intact.  ?Neck:  ?   Thyroid: No thyroid mass, thyromegaly or thyroid tenderness.  ?Pulmonary:  ?   Effort: Pulmonary effort is normal.  ?Skin: ?   Comments: 1 x 2 cm abscess present to the left buttocks near the gluteal fold  ?Neurological:  ?   Mental Status: She is alert.  ?Psychiatric:     ?   Mood and Affect: Mood normal.     ?   Behavior: Behavior normal.  ? ? ? ?UC  Treatments / Results  ?Labs ?(all labs ordered are listed, but only abnormal results are displayed) ?Labs Reviewed - No data to display ? ?EKG ? ? ?Radiology ?No results found. ? ?Procedures ?Procedures (including critical care time) ? ?Medications Ordered in UC ?Medications - No data to display ? ?Initial Impression / Assessment and Plan / UC Course  ?I have reviewed the triage vital signs and the nursing notes. ? ?Pertinent labs & imaging results that were available during my care of the patient were reviewed by me and considered in my medical decision making (see chart for details). ? ?Abscess of the left buttocks ?Irregular menstrual cycle ? ?Puncture and aspirate completed, able to expel a moderate amount of purulent drainage, patient tolerated well, doxycycline 7-day course prescribed, recommended warm compresses to the affected area to help facilitate drainage, given walking referral to dermatology as well as [facility] for further evaluation of hidradenitis and sac removal, may follow-up with urgent care as needed  ? ?Obliged to obtain TSH level today, pending, advised patient that she will need follow-up with her PCP for further evaluation and management of her concerning symptoms ?Final Clinical Impressions(s) / UC Diagnoses  ? ?Final diagnoses:  ?None  ? ?Discharge Instructions   ?None ?  ? ?ED Prescriptions   ?None ?  ? ?PDMP not reviewed this encounter. ?  ?[place], NP ?07/08/21 1147 ? ?

## 2021-09-28 ENCOUNTER — Ambulatory Visit: Payer: Self-pay

## 2022-06-27 ENCOUNTER — Other Ambulatory Visit (HOSPITAL_COMMUNITY): Payer: Self-pay

## 2022-06-27 MED ORDER — LISDEXAMFETAMINE DIMESYLATE 10 MG PO CHEW
10.0000 mg | CHEWABLE_TABLET | Freq: Every morning | ORAL | 0 refills | Status: DC
  Filled 2022-06-27: qty 30, 30d supply, fill #0

## 2022-06-28 ENCOUNTER — Other Ambulatory Visit (HOSPITAL_COMMUNITY): Payer: Self-pay

## 2022-06-29 ENCOUNTER — Other Ambulatory Visit (HOSPITAL_COMMUNITY): Payer: Self-pay

## 2022-07-02 ENCOUNTER — Other Ambulatory Visit (HOSPITAL_COMMUNITY): Payer: Self-pay

## 2022-07-03 ENCOUNTER — Other Ambulatory Visit (HOSPITAL_COMMUNITY): Payer: Self-pay

## 2022-07-03 MED ORDER — LISDEXAMFETAMINE DIMESYLATE 10 MG PO CHEW
10.0000 mg | CHEWABLE_TABLET | Freq: Every morning | ORAL | 0 refills | Status: DC
  Filled 2022-07-03: qty 30, 30d supply, fill #0

## 2022-07-17 ENCOUNTER — Other Ambulatory Visit (HOSPITAL_COMMUNITY): Payer: Self-pay

## 2022-07-17 MED ORDER — LISDEXAMFETAMINE DIMESYLATE 20 MG PO CHEW
20.0000 mg | CHEWABLE_TABLET | Freq: Every morning | ORAL | 0 refills | Status: DC
  Filled 2022-08-03 – 2022-08-16 (×2): qty 15, 15d supply, fill #0
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
## 2022-08-03 ENCOUNTER — Other Ambulatory Visit (HOSPITAL_COMMUNITY): Payer: Self-pay

## 2022-08-15 ENCOUNTER — Other Ambulatory Visit (HOSPITAL_COMMUNITY): Payer: Self-pay

## 2022-08-16 ENCOUNTER — Other Ambulatory Visit (HOSPITAL_COMMUNITY): Payer: Self-pay

## 2022-09-27 ENCOUNTER — Other Ambulatory Visit: Payer: Self-pay

## 2022-09-27 ENCOUNTER — Other Ambulatory Visit (HOSPITAL_COMMUNITY): Payer: Self-pay

## 2022-09-27 MED ORDER — SERTRALINE HCL 50 MG PO TABS
50.0000 mg | ORAL_TABLET | Freq: Every day | ORAL | 0 refills | Status: DC
  Filled 2022-09-27: qty 7, 7d supply, fill #0

## 2022-09-27 MED ORDER — LISDEXAMFETAMINE DIMESYLATE 20 MG PO CHEW
20.0000 mg | CHEWABLE_TABLET | Freq: Every morning | ORAL | 0 refills | Status: DC
  Filled 2022-09-27: qty 7, 7d supply, fill #0

## 2022-10-03 ENCOUNTER — Other Ambulatory Visit (HOSPITAL_COMMUNITY): Payer: Self-pay

## 2022-10-03 MED ORDER — SERTRALINE HCL 50 MG PO TABS
50.0000 mg | ORAL_TABLET | Freq: Every day | ORAL | 0 refills | Status: DC
  Filled 2022-10-03: qty 90, 90d supply, fill #0

## 2022-10-03 MED ORDER — LISDEXAMFETAMINE DIMESYLATE 30 MG PO CHEW
30.0000 mg | CHEWABLE_TABLET | Freq: Every morning | ORAL | 0 refills | Status: DC
  Filled 2022-10-03: qty 21, 21d supply, fill #0

## 2022-11-21 ENCOUNTER — Other Ambulatory Visit (HOSPITAL_COMMUNITY): Payer: Self-pay

## 2022-11-21 MED ORDER — LISDEXAMFETAMINE DIMESYLATE 30 MG PO CHEW
30.0000 mg | CHEWABLE_TABLET | Freq: Every morning | ORAL | 0 refills | Status: DC
  Filled 2022-11-21: qty 7, 7d supply, fill #0

## 2022-11-27 ENCOUNTER — Other Ambulatory Visit (HOSPITAL_COMMUNITY): Payer: Self-pay

## 2022-11-27 MED ORDER — LISDEXAMFETAMINE DIMESYLATE 30 MG PO CHEW
30.0000 mg | CHEWABLE_TABLET | Freq: Every morning | ORAL | 0 refills | Status: DC
  Filled 2022-11-27: qty 30, 30d supply, fill #0

## 2022-12-21 ENCOUNTER — Other Ambulatory Visit (HOSPITAL_COMMUNITY): Payer: Self-pay

## 2022-12-21 MED ORDER — IMIQUIMOD 5 % EX CREA
1.0000 | TOPICAL_CREAM | CUTANEOUS | 1 refills | Status: DC
  Filled 2022-12-21: qty 12, 28d supply, fill #0
  Filled 2023-01-09: qty 24, 56d supply, fill #0

## 2023-01-02 ENCOUNTER — Other Ambulatory Visit (HOSPITAL_COMMUNITY): Payer: Self-pay

## 2023-01-08 ENCOUNTER — Other Ambulatory Visit (HOSPITAL_COMMUNITY): Payer: Self-pay

## 2023-01-08 MED ORDER — LISDEXAMFETAMINE DIMESYLATE 40 MG PO CHEW
40.0000 mg | CHEWABLE_TABLET | Freq: Every morning | ORAL | 0 refills | Status: DC
Start: 2023-01-08 — End: 2023-02-06
  Filled 2023-01-08: qty 30, 30d supply, fill #0

## 2023-01-08 MED ORDER — SERTRALINE HCL 50 MG PO TABS
50.0000 mg | ORAL_TABLET | Freq: Every day | ORAL | 0 refills | Status: AC
  Filled 2023-01-08: qty 90, 90d supply, fill #0

## 2023-01-09 ENCOUNTER — Other Ambulatory Visit (HOSPITAL_COMMUNITY): Payer: Self-pay

## 2023-02-04 ENCOUNTER — Other Ambulatory Visit (HOSPITAL_COMMUNITY): Payer: Self-pay

## 2023-02-04 MED ORDER — SERTRALINE HCL 20 MG/ML PO CONC
60.0000 mg | Freq: Every day | ORAL | 1 refills | Status: DC
  Filled 2023-02-04: qty 60, 20d supply, fill #0

## 2023-02-06 ENCOUNTER — Other Ambulatory Visit (HOSPITAL_COMMUNITY): Payer: Self-pay

## 2023-02-06 MED ORDER — LISDEXAMFETAMINE DIMESYLATE 40 MG PO CHEW
40.0000 mg | CHEWABLE_TABLET | Freq: Every morning | ORAL | 0 refills | Status: DC
  Filled 2023-02-06 – 2023-03-06 (×2): qty 30, 30d supply, fill #0

## 2023-02-18 ENCOUNTER — Other Ambulatory Visit (HOSPITAL_COMMUNITY): Payer: Self-pay

## 2023-03-06 ENCOUNTER — Other Ambulatory Visit (HOSPITAL_COMMUNITY): Payer: Self-pay

## 2023-04-25 ENCOUNTER — Other Ambulatory Visit (HOSPITAL_COMMUNITY): Payer: Self-pay

## 2023-04-25 MED ORDER — VALACYCLOVIR HCL 1 G PO TABS
1.0000 g | ORAL_TABLET | Freq: Two times a day (BID) | ORAL | 0 refills | Status: AC
  Filled 2023-04-25: qty 20, 10d supply, fill #0

## 2023-04-30 ENCOUNTER — Other Ambulatory Visit (HOSPITAL_COMMUNITY): Payer: Self-pay

## 2023-04-30 ENCOUNTER — Encounter (HOSPITAL_COMMUNITY): Payer: Self-pay

## 2023-05-01 ENCOUNTER — Other Ambulatory Visit (HOSPITAL_COMMUNITY): Payer: Self-pay

## 2023-05-02 ENCOUNTER — Other Ambulatory Visit: Payer: Self-pay

## 2023-05-02 ENCOUNTER — Other Ambulatory Visit (HOSPITAL_COMMUNITY): Payer: Self-pay

## 2023-05-02 MED ORDER — SERTRALINE HCL 20 MG/ML PO CONC
100.0000 mg | Freq: Every day | ORAL | 1 refills | Status: DC
  Filled 2023-05-02: qty 60, 12d supply, fill #0

## 2023-05-02 MED ORDER — LISDEXAMFETAMINE DIMESYLATE 50 MG PO CHEW
50.0000 mg | CHEWABLE_TABLET | Freq: Every morning | ORAL | 0 refills | Status: DC
  Filled 2023-05-02: qty 30, 30d supply, fill #0

## 2023-05-16 ENCOUNTER — Other Ambulatory Visit (HOSPITAL_COMMUNITY): Payer: Self-pay

## 2023-05-21 ENCOUNTER — Other Ambulatory Visit (HOSPITAL_COMMUNITY): Payer: Self-pay

## 2023-05-21 MED ORDER — LISDEXAMFETAMINE DIMESYLATE 50 MG PO CHEW: 50.0000 mg | CHEWABLE_TABLET | Freq: Every morning | ORAL | 0 refills | Status: DC

## 2023-06-05 ENCOUNTER — Other Ambulatory Visit (HOSPITAL_COMMUNITY): Payer: Self-pay

## 2023-06-05 MED ORDER — ACYCLOVIR 200 MG/5ML PO SUSP
800.0000 mg | Freq: Two times a day (BID) | ORAL | 0 refills | Status: DC
  Filled 2023-06-05: qty 200, 5d supply, fill #0

## 2023-06-11 ENCOUNTER — Other Ambulatory Visit (HOSPITAL_COMMUNITY): Payer: Self-pay

## 2023-06-11 MED ORDER — LISDEXAMFETAMINE DIMESYLATE 50 MG PO CHEW
50.0000 mg | CHEWABLE_TABLET | Freq: Every day | ORAL | 0 refills | Status: DC
Start: 2023-06-11 — End: 2023-07-23
  Filled 2023-06-11: qty 30, 30d supply, fill #0

## 2023-06-11 MED ORDER — SERTRALINE HCL 20 MG/ML PO CONC
60.0000 mg | Freq: Every day | ORAL | 2 refills | Status: AC
  Filled 2023-06-11: qty 60, 20d supply, fill #0

## 2023-06-17 ENCOUNTER — Other Ambulatory Visit (HOSPITAL_COMMUNITY): Payer: Self-pay

## 2023-07-10 ENCOUNTER — Other Ambulatory Visit (HOSPITAL_COMMUNITY): Payer: Self-pay

## 2023-07-23 ENCOUNTER — Other Ambulatory Visit (HOSPITAL_COMMUNITY): Payer: Self-pay

## 2023-07-23 MED ORDER — LISDEXAMFETAMINE DIMESYLATE 50 MG PO CHEW
50.0000 mg | CHEWABLE_TABLET | Freq: Every day | ORAL | 0 refills | Status: DC
  Filled 2023-07-23: qty 30, 30d supply, fill #0

## 2023-08-26 ENCOUNTER — Other Ambulatory Visit (HOSPITAL_COMMUNITY): Payer: Self-pay

## 2023-08-27 ENCOUNTER — Other Ambulatory Visit (HOSPITAL_COMMUNITY): Payer: Self-pay

## 2023-08-27 ENCOUNTER — Encounter (HOSPITAL_COMMUNITY): Payer: Self-pay

## 2023-08-27 MED ORDER — LISDEXAMFETAMINE DIMESYLATE 50 MG PO CHEW
50.0000 mg | CHEWABLE_TABLET | Freq: Every day | ORAL | 0 refills | Status: DC
  Filled 2023-08-27: qty 30, 30d supply, fill #0

## 2023-10-02 ENCOUNTER — Other Ambulatory Visit (HOSPITAL_COMMUNITY): Payer: Self-pay

## 2023-10-02 MED ORDER — LISDEXAMFETAMINE DIMESYLATE 50 MG PO CHEW
50.0000 mg | CHEWABLE_TABLET | Freq: Every day | ORAL | 0 refills | Status: DC
Start: 2023-10-02 — End: 2023-11-19
  Filled 2023-10-02: qty 30, 30d supply, fill #0

## 2023-11-19 ENCOUNTER — Other Ambulatory Visit (HOSPITAL_COMMUNITY): Payer: Self-pay

## 2023-11-19 MED ORDER — LISDEXAMFETAMINE DIMESYLATE 50 MG PO CHEW
50.0000 mg | CHEWABLE_TABLET | Freq: Every day | ORAL | 0 refills | Status: DC
  Filled 2023-11-19: qty 30, 30d supply, fill #0

## 2023-12-19 ENCOUNTER — Other Ambulatory Visit (HOSPITAL_COMMUNITY): Payer: Self-pay

## 2023-12-19 MED ORDER — LISDEXAMFETAMINE DIMESYLATE 50 MG PO CHEW
50.0000 mg | CHEWABLE_TABLET | Freq: Every day | ORAL | 0 refills | Status: DC
  Filled 2023-12-19: qty 30, 30d supply, fill #0

## 2023-12-19 MED ORDER — SERTRALINE HCL 20 MG/ML PO CONC
100.0000 mg | Freq: Every day | ORAL | 2 refills | Status: AC
  Filled 2023-12-19: qty 60, 12d supply, fill #0

## 2023-12-26 ENCOUNTER — Other Ambulatory Visit (HOSPITAL_COMMUNITY): Payer: Self-pay

## 2024-01-26 ENCOUNTER — Other Ambulatory Visit (HOSPITAL_COMMUNITY): Payer: Self-pay

## 2024-01-27 ENCOUNTER — Other Ambulatory Visit (HOSPITAL_COMMUNITY): Payer: Self-pay

## 2024-01-27 MED ORDER — LISDEXAMFETAMINE DIMESYLATE 50 MG PO CHEW
50.0000 mg | CHEWABLE_TABLET | Freq: Every day | ORAL | 0 refills | Status: DC
  Filled 2024-01-27: qty 30, 30d supply, fill #0

## 2024-02-05 ENCOUNTER — Encounter (HOSPITAL_COMMUNITY): Payer: Self-pay

## 2024-02-05 ENCOUNTER — Other Ambulatory Visit (HOSPITAL_COMMUNITY): Payer: Self-pay

## 2024-02-05 ENCOUNTER — Telehealth (HOSPITAL_COMMUNITY): Payer: Self-pay | Admitting: Internal Medicine

## 2024-02-05 ENCOUNTER — Ambulatory Visit (INDEPENDENT_AMBULATORY_CARE_PROVIDER_SITE_OTHER)

## 2024-02-05 ENCOUNTER — Ambulatory Visit

## 2024-02-05 ENCOUNTER — Ambulatory Visit (HOSPITAL_COMMUNITY)
Admission: EM | Admit: 2024-02-05 | Discharge: 2024-02-05 | Disposition: A | Discharge status: Home / Self Care | Attending: Internal Medicine | Admitting: Internal Medicine

## 2024-02-05 DIAGNOSIS — R509 Fever, unspecified: Secondary | ICD-10-CM | POA: Diagnosis not present

## 2024-02-05 DIAGNOSIS — J069 Acute upper respiratory infection, unspecified: Secondary | ICD-10-CM

## 2024-02-05 DIAGNOSIS — J40 Bronchitis, not specified as acute or chronic: Secondary | ICD-10-CM | POA: Diagnosis not present

## 2024-02-05 DIAGNOSIS — R0602 Shortness of breath: Secondary | ICD-10-CM | POA: Diagnosis not present

## 2024-02-05 DIAGNOSIS — J189 Pneumonia, unspecified organism: Secondary | ICD-10-CM

## 2024-02-05 DIAGNOSIS — J329 Chronic sinusitis, unspecified: Secondary | ICD-10-CM

## 2024-02-05 LAB — POC COVID19/FLU A&B COMBO
Covid Antigen, POC: NEGATIVE
Influenza A Antigen, POC: NEGATIVE
Influenza B Antigen, POC: NEGATIVE

## 2024-02-05 MED ORDER — GUAIFENESIN ER 1200 MG PO TB12
1200.0000 mg | ORAL_TABLET | Freq: Two times a day (BID) | ORAL | 0 refills | Status: AC
  Filled 2024-02-05: qty 14, 7d supply, fill #0

## 2024-02-05 MED ORDER — METHYLPREDNISOLONE SODIUM SUCC 125 MG IJ SOLR
80.0000 mg | Freq: Once | INTRAMUSCULAR | Status: AC
  Administered 2024-02-05: 80 mg via INTRAMUSCULAR

## 2024-02-05 MED ORDER — IBUPROFEN 800 MG PO TABS: 800.0000 mg | ORAL_TABLET | Freq: Once | ORAL | Status: DC

## 2024-02-05 MED ORDER — ALBUTEROL SULFATE HFA 108 (90 BASE) MCG/ACT IN AERS
1.0000 | INHALATION_SPRAY | Freq: Four times a day (QID) | RESPIRATORY_TRACT | 0 refills | Status: AC | PRN
  Filled 2024-02-05: qty 6.7, 20d supply, fill #0

## 2024-02-05 MED ORDER — ALBUTEROL SULFATE (2.5 MG/3ML) 0.083% IN NEBU
INHALATION_SOLUTION | RESPIRATORY_TRACT | Status: AC
  Filled 2024-02-05: qty 3

## 2024-02-05 MED ORDER — PREDNISONE 20 MG PO TABS
40.0000 mg | ORAL_TABLET | Freq: Every day | ORAL | 0 refills | Status: AC
  Filled 2024-02-05: qty 10, 5d supply, fill #0

## 2024-02-05 MED ORDER — PROMETHAZINE-DM 6.25-15 MG/5ML PO SYRP
5.0000 mL | ORAL_SOLUTION | Freq: Every evening | ORAL | 0 refills | Status: AC | PRN
  Filled 2024-02-05: qty 118, 23d supply, fill #0

## 2024-02-05 MED ORDER — AMOXICILLIN-POT CLAVULANATE 875-125 MG PO TABS
1.0000 | ORAL_TABLET | Freq: Two times a day (BID) | ORAL | 0 refills | Status: AC
  Filled 2024-02-05: qty 14, 7d supply, fill #0

## 2024-02-05 MED ORDER — ACETAMINOPHEN 325 MG PO TABS
975.0000 mg | ORAL_TABLET | Freq: Once | ORAL | Status: AC
  Administered 2024-02-05: 975 mg via ORAL

## 2024-02-05 MED ORDER — ALBUTEROL SULFATE (2.5 MG/3ML) 0.083% IN NEBU
2.5000 mg | INHALATION_SOLUTION | Freq: Once | RESPIRATORY_TRACT | Status: AC
  Administered 2024-02-05: 2.5 mg via RESPIRATORY_TRACT

## 2024-02-05 MED ORDER — AZITHROMYCIN 250 MG PO TABS
ORAL_TABLET | ORAL | 0 refills | Status: AC
  Filled 2024-02-05: qty 6, 5d supply, fill #0

## 2024-02-05 MED ORDER — ACETAMINOPHEN 325 MG PO TABS
ORAL_TABLET | ORAL | Status: AC
  Filled 2024-02-05: qty 3

## 2024-02-05 MED ORDER — METHYLPREDNISOLONE SODIUM SUCC 125 MG IJ SOLR
INTRAMUSCULAR | Status: AC
  Filled 2024-02-05: qty 2

## 2024-02-05 NOTE — ED Triage Notes (Signed)
 Pt has c/o fever that started yesterday and a cough that started 3 days ago. Took tylenol  at 7:30 pm with little relief. States pain in chest when inhaling and exhaling that started yesterday.

## 2024-02-05 NOTE — ED Provider Notes (Signed)
 [facility]    CSN: [id number] Arrival date & time: 02/05/24  9180      History   Chief Complaint Chief Complaint  Patient presents with   Fever    HPI Katrina Cannon is a 28 y.o. female.   Katrina Cannon is a 28 y.o. female presenting for chief complaint of cough, congestion, fever, chills, and generalized fatigue that started 7 days ago.  Cough is productive with yellow/green mucus.  She developed right sided chest pain wrapping around into her right rib cage 2 days ago.  She reports shortness of breath associated with coughing.  Right sided chest pain is worsened by taking in a deep breath.  Max temp at home yesterday was 103.  Fever has responded well to use of Tylenol  at home. She is a current everyday marijuana smoker and has a history of exercise-induced asthma.  She denies history of COPD or tobacco abuse. She denies nausea, vomiting, diarrhea, abdominal pain, and rashes.  Denies recent antibiotic or steroid use in the last 90 days.  She is taking over-the-counter medications with minimal relief of symptoms.   Fever   Past Medical History:  Diagnosis Date   Anemia    Exercise-induced asthma    Hx of chlamydia infection     Patient Active Problem List   Diagnosis Date Noted   Iron deficiency anemia 02/14/2016   Pain in toes of both feet 02/14/2016   Pregnancy 02/12/2015   SVD (spontaneous vaginal delivery) 02/12/2015    History reviewed. No pertinent surgical history.  OB History     Gravida  1   Para  1   Term  1   Preterm      AB      Living  1      SAB      IAB      Ectopic      Multiple  0   Live Births  1            Home Medications    Prior to Admission medications   Medication Sig Start Date End Date Taking? Authorizing Provider  albuterol  (VENTOLIN  HFA) 108 (90 Base) MCG/ACT inhaler Inhale 1-2 puffs into the lungs every 6 (six) hours as needed for wheezing or shortness of breath. 02/05/24  Yes Enedelia Dorna HERO, FNP  amoxicillin -clavulanate (AUGMENTIN ) 875-125 MG tablet Take 1 tablet by mouth every 12 (twelve) hours for 7 days. 02/05/24 02/12/24 Yes StanhopeDorna HERO, FNP  azithromycin  (ZITHROMAX ) 250 MG tablet Take 2 tablets (500 mg total) by mouth daily for 1 day, THEN 1 tablet (250 mg total) daily for 4 days. 02/05/24 02/10/24 Yes Enedelia Dorna HERO, FNP  Guaifenesin  1200 MG TB12 Take 1 tablet (1,200 mg total) by mouth in the morning and at bedtime. 02/05/24  Yes Enedelia Dorna HERO, FNP  predniSONE  (DELTASONE ) 20 MG tablet Take 2 tablets (40 mg total) by mouth daily with breakfast for 5 days. 02/05/24 02/10/24 Yes Enedelia Dorna HERO, FNP  promethazine -dextromethorphan (PROMETHAZINE -DM) 6.25-15 MG/5ML syrup Take 5 mLs by mouth at bedtime as needed for cough. 02/05/24  Yes Enedelia Dorna HERO, FNP  sertraline  (ZOLOFT ) 20 MG/ML concentrated solution Take 3 mLs (60 mg total) by mouth daily. 06/11/23     sertraline  (ZOLOFT ) 20 MG/ML concentrated solution Take 5 mLs (100 mg total) by mouth daily. 12/19/23     sertraline  (ZOLOFT ) 50 MG tablet Take 1 tablet (50 mg total) by mouth daily. 01/08/23     valACYclovir  (  VALTREX ) 1000 MG tablet Take 1 tablet (1,000 mg total) by mouth 2 (two) times daily for 10 days 04/25/23       Family History Family History  Problem Relation Age of Onset   Healthy Mother    Healthy Father    Hypertension Maternal Grandmother    Hypertension Maternal Grandfather     Social History Social History   Tobacco Use   Smoking status: Never   Smokeless tobacco: Never  Vaping Use   Vaping status: Never Used  Substance Use Topics   Alcohol use: Yes    Comment: occasionally   Drug use: No     Allergies   Patient has no known allergies.   Review of Systems Review of Systems  Constitutional:  Positive for fever.  Per HPI   Physical Exam Triage Vital Signs ED Triage Vitals  Encounter Vitals Group     BP 02/05/24 0836 110/72     Girls  Systolic BP Percentile --      Girls Diastolic BP Percentile --      Boys Systolic BP Percentile --      Boys Diastolic BP Percentile --      Pulse Rate 02/05/24 0836 (!) 107     Resp 02/05/24 0836 18     Temp 02/05/24 0836 100.2 F (37.9 C)     Temp Source 02/05/24 0836 Oral     SpO2 02/05/24 0836 95 %     Weight --      Height --      Head Circumference --      Peak Flow --      Pain Score 02/05/24 0838 6     Pain Loc --      Pain Education --      Exclude from Growth Chart --    No data found.  Updated Vital Signs BP 110/72 (BP Location: Left Arm)   Pulse (!) 107   Temp 100.2 F (37.9 C) (Oral)   Resp 18   SpO2 95%   Visual Acuity Right Eye Distance:   Left Eye Distance:   Bilateral Distance:    Right Eye Near:   Left Eye Near:    Bilateral Near:     Physical Exam Vitals and nursing note reviewed.  Constitutional:      Appearance: She is ill-appearing. She is not toxic-appearing.  HENT:     Head: Normocephalic and atraumatic.     Right Ear: Hearing, tympanic membrane, ear canal and external ear normal.     Left Ear: Hearing, tympanic membrane, ear canal and external ear normal.     Nose: Congestion present.     Mouth/Throat:     Lips: Pink.     Mouth: Mucous membranes are moist. No injury or oral lesions.     Dentition: Normal dentition.     Tongue: No lesions.     Pharynx: Oropharynx is clear. Uvula midline. No pharyngeal swelling, oropharyngeal exudate, posterior oropharyngeal erythema, uvula swelling or postnasal drip.     Tonsils: No tonsillar exudate.  Eyes:     General: Lids are normal. Vision grossly intact. Gaze aligned appropriately.     Extraocular Movements: Extraocular movements intact.     Conjunctiva/sclera: Conjunctivae normal.  Neck:     Trachea: Trachea and phonation normal.  Cardiovascular:     Rate and Rhythm: Normal rate and regular rhythm.     Heart sounds: Normal heart sounds, S1 normal and S2 normal.  Pulmonary:     Effort:  Pulmonary effort is normal. No respiratory distress.     Breath sounds: Normal air entry. Wheezing, rhonchi and rales present.     Comments: Focal rhonchi and rales to the right lower lung field, expiratory wheezing heard to all lung fields bilaterally throughout the chest. Chest:     Chest wall: No tenderness.  Musculoskeletal:     Cervical back: Neck supple.     Right lower leg: No edema.     Left lower leg: No edema.  Lymphadenopathy:     Cervical: Cervical adenopathy present.  Skin:    General: Skin is warm and dry.     Capillary Refill: Capillary refill takes less than 2 seconds.     Findings: No rash.  Neurological:     General: No focal deficit present.     Mental Status: She is alert and oriented to person, place, and time. Mental status is at baseline.     Cranial Nerves: No dysarthria or facial asymmetry.  Psychiatric:        Mood and Affect: Mood normal.        Speech: Speech normal.        Behavior: Behavior normal.        Thought Content: Thought content normal.        Judgment: Judgment normal.      UC Treatments / Results  Labs (all labs ordered are listed, but only abnormal results are displayed) Labs Reviewed  POC COVID19/FLU A&B COMBO - Normal    EKG   Radiology DG Chest 2 View Result Date: 02/05/2024 EXAM: 2 VIEW(S) XRAY OF THE CHEST 02/05/2024 10:06:21 AM COMPARISON: None available. CLINICAL HISTORY: cough and shortness of breath with pleuritic chest pain for the last 7 days FINDINGS: LUNGS AND PLEURA: Right middle and right lower lobe opacities consistent with pneumonia. No pleural effusion. No pneumothorax. HEART AND MEDIASTINUM: No acute abnormality of the cardiac and mediastinal silhouettes. BONES AND SOFT TISSUES: No acute osseous abnormality. IMPRESSION: 1. Right middle and right lower lobe opacities consistent with pneumonia. Electronically signed by: Evalene Coho MD 02/05/2024 10:34 AM EST RP Workstation: [id number]     Procedures Procedures (including critical care time)  Medications Ordered in UC Medications  albuterol  (PROVENTIL ) (2.5 MG/3ML) 0.083% nebulizer solution 2.5 mg (2.5 mg Nebulization Given 02/05/24 0942)  acetaminophen  (TYLENOL ) tablet 975 mg (975 mg Oral Given 02/05/24 0941)  methylPREDNISolone  sodium succinate (SOLU-MEDROL ) 125 mg/2 mL injection 80 mg (80 mg Intramuscular Given 02/05/24 0942)    Initial Impression / Assessment and Plan / UC Course  I have reviewed the triage vital signs and the nursing notes.  Pertinent labs & imaging results that were available during my care of the patient were reviewed by me and considered in my medical decision making (see chart for details).   1.  Sinobronchitis, shortness of breath, fever Presentation suspicious for sinobronchitis.  Chest x-ray is unremarkable for signs of acute cardiopulmonary abnormality on wet read, staff will call if radiology reread shows abnormality requiring change in treatment plan. Albuterol  nebulizer given in clinic with significant improvement in shortness of breath and chest pain prior to discharge. 80 mg Solu-Medrol  IM given as well. Prednisone  burst to be started tomorrow. Augmentin  twice daily for 7 days. Promethazine  DM as needed, guaifenesin  as needed, and albuterol  inhaler as needed prescribed. Recommend further OTC/prescription medications for supportive care and symptomatic relief as outlined in AVS.    Temperature reduced to 98.8 after Tylenol  administration in clinic and heart rate reduced to 96.  She does not meet Wells criteria for PE evaluation, low suspicion for pulmonary embolism though this has been considered as a differential.   Counseled patient on potential for adverse effects with medications prescribed/recommended today, strict ER and return-to-clinic precautions discussed, patient verbalized understanding.    Final Clinical Impressions(s) / UC Diagnoses   Final diagnoses:  Shortness of  breath  Sinobronchitis  Fever, unspecified fever cause     Discharge Instructions      Your evaluation shows you have a bacterial sinus infection plus a viral infection of the upper airways of your lungs. Use the following medicines to help with your symptoms:  Take prednisone  steroid pills as prescribed starting tomorrow.  2 pills- 40mg - once daily each morning with food/breakfast for 5 days Do not take ibuprofen /naproxen /other NSAIDs while taking steroid pills as this can cause upset stomach.   Take antibiotic as prescribed each morning and each evening for 7 days with food.   Albuterol  inhaler 2 puffs every 4-6 hours as needed for cough, shortness of breath, and wheezing.  Guaifenesin  (mucinex ) as needed for cough/nasal congestion.   Promethazine  DM as needed for cough at bedtime- this medicine will make you sleepy so only use at nighttime.   If you develop any new or worsening symptoms or if your symptoms do not start to improve, please return here or follow-up with your primary care provider. If your symptoms are severe, please go to the emergency room.      ED Prescriptions     Medication Sig Dispense Auth. Provider   promethazine -dextromethorphan (PROMETHAZINE -DM) 6.25-15 MG/5ML syrup Take 5 mLs by mouth at bedtime as needed for cough. 118 mL Enedelia Going M, FNP   Guaifenesin  1200 MG TB12 Take 1 tablet (1,200 mg total) by mouth in the morning and at bedtime. 14 tablet Dafna Romo M, FNP   amoxicillin -clavulanate (AUGMENTIN ) 875-125 MG tablet Take 1 tablet by mouth every 12 (twelve) hours for 7 days. 14 tablet Enedelia Going HERO, FNP   albuterol  (VENTOLIN  HFA) 108 (90 Base) MCG/ACT inhaler Inhale 1-2 puffs into the lungs every 6 (six) hours as needed for wheezing or shortness of breath. 6.7 g Enedelia Going M, FNP   predniSONE  (DELTASONE ) 20 MG tablet Take 2 tablets (40 mg total) by mouth daily with breakfast for 5 days. 10 tablet Enedelia Going HERO, FNP   azithromycin  (ZITHROMAX ) 250 MG tablet Take 2 tablets (500 mg total) by mouth daily for 1 day, THEN 1 tablet (250 mg total) daily for 4 days. 6 tablet Enedelia Going HERO, FNP      PDMP not reviewed this encounter.   Enedelia Going HERO, [place] 02/05/24 1116

## 2024-02-05 NOTE — Discharge Instructions (Addendum)
 Your evaluation shows you have a bacterial sinus infection plus a viral infection of the upper airways of your lungs. Use the following medicines to help with your symptoms:  Take prednisone  steroid pills as prescribed starting tomorrow.  2 pills- 40mg - once daily each morning with food/breakfast for 5 days Do not take ibuprofen/naproxen/other NSAIDs while taking steroid pills as this can cause upset stomach.   Take antibiotic as prescribed each morning and each evening for 7 days with food.   Albuterol  inhaler 2 puffs every 4-6 hours as needed for cough, shortness of breath, and wheezing.  Guaifenesin (mucinex) as needed for cough/nasal congestion.   Promethazine  DM as needed for cough at bedtime- this medicine will make you sleepy so only use at nighttime.   If you develop any new or worsening symptoms or if your symptoms do not start to improve, please return here or follow-up with your primary care provider. If your symptoms are severe, please go to the emergency room.

## 2024-02-05 NOTE — Telephone Encounter (Signed)
 Chest x-ray radiology reread from today's visit shows right middle and lower lobe opacities consistent with acute pneumonia infection. Patient was prescribed Augmentin  at time of visit, azithromycin  added as a prescription for dual coverage to treat pneumonia given history of asthma.  Continue with treatment plan as discussed at clinic.  Called patient to update her using 2 patient identifiers, she expresses understanding and agreement with plan.  All questions answered.  Follow-up with PCP in the next 2 weeks to ensure resolution of pneumonia.

## 2024-02-13 ENCOUNTER — Other Ambulatory Visit (HOSPITAL_COMMUNITY): Payer: Self-pay

## 2024-02-13 MED ORDER — IPRATROPIUM BROMIDE 0.03 % NA SOLN
1.0000 | Freq: Three times a day (TID) | NASAL | 3 refills | Status: AC | PRN
  Filled 2024-02-13: qty 30, 34d supply, fill #0

## 2024-02-25 ENCOUNTER — Other Ambulatory Visit (HOSPITAL_COMMUNITY): Payer: Self-pay

## 2024-03-17 ENCOUNTER — Other Ambulatory Visit (HOSPITAL_COMMUNITY): Payer: Self-pay

## 2024-03-17 MED ORDER — LISDEXAMFETAMINE DIMESYLATE 50 MG PO CHEW
50.0000 mg | CHEWABLE_TABLET | Freq: Every day | ORAL | 0 refills | Status: AC
  Filled 2024-03-17: qty 30, 30d supply, fill #0
